# Patient Record
Sex: Female | Born: 1965 | Race: White | Hispanic: No | Smoking: Current every day smoker
Health system: Southern US, Community
[De-identification: ages and names within clinical notes are randomized; demographics above are authoritative.]

## PROBLEM LIST (undated history)

## (undated) DIAGNOSIS — R928 Other abnormal and inconclusive findings on diagnostic imaging of breast: Secondary | ICD-10-CM

## (undated) DIAGNOSIS — G40909 Epilepsy, unspecified, not intractable, without status epilepticus: Secondary | ICD-10-CM

## (undated) HISTORY — DX: Epilepsy, unspecified, not intractable, without status epilepticus: G40.909

## (undated) HISTORY — PX: WISDOM TOOTH EXTRACTION: SHX21

## (undated) HISTORY — PX: MASTECTOMY: SHX3

## (undated) HISTORY — PX: CHOLECYSTECTOMY: SHX55

## (undated) HISTORY — DX: Other abnormal and inconclusive findings on diagnostic imaging of breast: R92.8

---

## 1998-09-21 ENCOUNTER — Ambulatory Visit (HOSPITAL_BASED_OUTPATIENT_CLINIC_OR_DEPARTMENT_OTHER): Admission: RE | Admit: 1998-09-21 | Discharge: 1998-09-21 | Payer: Self-pay | Admitting: General Surgery

## 1999-02-08 ENCOUNTER — Other Ambulatory Visit: Admission: RE | Admit: 1999-02-08 | Discharge: 1999-02-08 | Payer: Self-pay | Admitting: *Deleted

## 1999-03-01 ENCOUNTER — Ambulatory Visit (HOSPITAL_COMMUNITY): Admission: RE | Admit: 1999-03-01 | Discharge: 1999-03-01 | Payer: Self-pay | Admitting: *Deleted

## 1999-03-01 ENCOUNTER — Encounter: Payer: Self-pay | Admitting: *Deleted

## 1999-10-10 ENCOUNTER — Inpatient Hospital Stay (HOSPITAL_COMMUNITY): Admission: AD | Admit: 1999-10-10 | Discharge: 1999-10-10 | Payer: Self-pay | Admitting: Obstetrics and Gynecology

## 1999-10-12 ENCOUNTER — Observation Stay (HOSPITAL_COMMUNITY): Admission: AD | Admit: 1999-10-12 | Discharge: 1999-10-13 | Payer: Self-pay | Admitting: *Deleted

## 2000-01-07 ENCOUNTER — Inpatient Hospital Stay (HOSPITAL_COMMUNITY): Admission: AD | Admit: 2000-01-07 | Discharge: 2000-01-07 | Payer: Self-pay | Admitting: *Deleted

## 2000-01-15 ENCOUNTER — Encounter (INDEPENDENT_AMBULATORY_CARE_PROVIDER_SITE_OTHER): Payer: Self-pay

## 2000-01-15 ENCOUNTER — Inpatient Hospital Stay (HOSPITAL_COMMUNITY): Admission: AD | Admit: 2000-01-15 | Discharge: 2000-01-18 | Payer: Self-pay | Admitting: Obstetrics and Gynecology

## 2000-02-19 ENCOUNTER — Other Ambulatory Visit: Admission: RE | Admit: 2000-02-19 | Discharge: 2000-02-19 | Payer: Self-pay | Admitting: *Deleted

## 2000-05-13 ENCOUNTER — Emergency Department (HOSPITAL_COMMUNITY): Admission: EM | Admit: 2000-05-13 | Discharge: 2000-05-13 | Payer: Self-pay

## 2001-02-25 ENCOUNTER — Encounter: Payer: Self-pay | Admitting: *Deleted

## 2001-02-25 ENCOUNTER — Encounter: Admission: RE | Admit: 2001-02-25 | Discharge: 2001-02-25 | Payer: Self-pay | Admitting: *Deleted

## 2001-06-11 ENCOUNTER — Other Ambulatory Visit: Admission: RE | Admit: 2001-06-11 | Discharge: 2001-06-11 | Payer: Self-pay | Admitting: Obstetrics and Gynecology

## 2001-10-27 ENCOUNTER — Emergency Department (HOSPITAL_COMMUNITY): Admission: EM | Admit: 2001-10-27 | Discharge: 2001-10-27 | Payer: Self-pay | Admitting: Emergency Medicine

## 2001-10-27 ENCOUNTER — Encounter: Payer: Self-pay | Admitting: *Deleted

## 2001-12-23 ENCOUNTER — Other Ambulatory Visit: Admission: RE | Admit: 2001-12-23 | Discharge: 2001-12-23 | Payer: Self-pay | Admitting: Obstetrics and Gynecology

## 2003-05-24 ENCOUNTER — Encounter: Payer: Self-pay | Admitting: Orthopedic Surgery

## 2003-05-24 ENCOUNTER — Ambulatory Visit (HOSPITAL_COMMUNITY): Admission: RE | Admit: 2003-05-24 | Discharge: 2003-05-24 | Payer: Self-pay | Admitting: Orthopedic Surgery

## 2003-05-27 ENCOUNTER — Ambulatory Visit (HOSPITAL_BASED_OUTPATIENT_CLINIC_OR_DEPARTMENT_OTHER): Admission: RE | Admit: 2003-05-27 | Discharge: 2003-05-27 | Payer: Self-pay | Admitting: Orthopedic Surgery

## 2003-06-07 ENCOUNTER — Ambulatory Visit (HOSPITAL_BASED_OUTPATIENT_CLINIC_OR_DEPARTMENT_OTHER): Admission: RE | Admit: 2003-06-07 | Discharge: 2003-06-07 | Payer: Self-pay | Admitting: Orthopedic Surgery

## 2003-07-12 ENCOUNTER — Ambulatory Visit (HOSPITAL_BASED_OUTPATIENT_CLINIC_OR_DEPARTMENT_OTHER): Admission: RE | Admit: 2003-07-12 | Discharge: 2003-07-12 | Payer: Self-pay | Admitting: Orthopedic Surgery

## 2005-03-27 ENCOUNTER — Encounter: Admission: RE | Admit: 2005-03-27 | Discharge: 2005-03-27 | Payer: Self-pay | Admitting: Surgery

## 2005-05-24 ENCOUNTER — Encounter: Admission: RE | Admit: 2005-05-24 | Discharge: 2005-05-24 | Payer: Self-pay | Admitting: Surgery

## 2005-05-27 ENCOUNTER — Encounter (INDEPENDENT_AMBULATORY_CARE_PROVIDER_SITE_OTHER): Payer: Self-pay | Admitting: Specialist

## 2005-05-27 ENCOUNTER — Ambulatory Visit (HOSPITAL_BASED_OUTPATIENT_CLINIC_OR_DEPARTMENT_OTHER): Admission: RE | Admit: 2005-05-27 | Discharge: 2005-05-27 | Payer: Self-pay | Admitting: Surgery

## 2005-05-27 ENCOUNTER — Encounter: Admission: RE | Admit: 2005-05-27 | Discharge: 2005-05-27 | Payer: Self-pay | Admitting: Surgery

## 2005-05-27 ENCOUNTER — Ambulatory Visit (HOSPITAL_COMMUNITY): Admission: RE | Admit: 2005-05-27 | Discharge: 2005-05-27 | Payer: Self-pay | Admitting: Surgery

## 2005-05-30 ENCOUNTER — Emergency Department (HOSPITAL_COMMUNITY): Admission: EM | Admit: 2005-05-30 | Discharge: 2005-05-30 | Payer: Self-pay | Admitting: Emergency Medicine

## 2005-06-11 ENCOUNTER — Ambulatory Visit (HOSPITAL_COMMUNITY): Admission: RE | Admit: 2005-06-11 | Discharge: 2005-06-11 | Payer: Self-pay | Admitting: Neurology

## 2006-09-04 ENCOUNTER — Encounter: Admission: RE | Admit: 2006-09-04 | Discharge: 2006-09-04 | Payer: Self-pay | Admitting: Gastroenterology

## 2007-03-13 ENCOUNTER — Encounter: Admission: RE | Admit: 2007-03-13 | Discharge: 2007-03-13 | Payer: Self-pay | Admitting: Surgery

## 2008-04-05 ENCOUNTER — Emergency Department (HOSPITAL_COMMUNITY): Admission: EM | Admit: 2008-04-05 | Discharge: 2008-04-05 | Payer: Self-pay | Admitting: Emergency Medicine

## 2008-04-25 ENCOUNTER — Encounter: Admission: RE | Admit: 2008-04-25 | Discharge: 2008-04-25 | Payer: Self-pay | Admitting: Surgery

## 2008-10-04 ENCOUNTER — Encounter: Admission: RE | Admit: 2008-10-04 | Discharge: 2008-10-04 | Payer: Self-pay | Admitting: Orthopedic Surgery

## 2008-11-01 ENCOUNTER — Emergency Department (HOSPITAL_COMMUNITY): Admission: EM | Admit: 2008-11-01 | Discharge: 2008-11-01 | Payer: Self-pay | Admitting: Emergency Medicine

## 2009-12-17 IMAGING — CT CT ANGIO CHEST
1 of 2 series · 19 of 32 positions shown · IV contrast (APPLIED)
Comparison: None

CLINICAL DATA: Seizures. Syncope.  Elevated D-dimer.

CT ANGIOGRAPHY CHEST
TECHNIQUE: Multidetector CT imaging of the chest was performed
using the standard protocol during bolus administration of
intravenous contrast. Multiplanar CT image reconstructions
including MIPs were obtained to evaluate the vascular anatomy.
Contrast: 80 ml of omni 300

[Series 5: pe thins @ 1mm · axial · 0.65mm/px · z∈[-267,-9]mm · 19 of 284 slices shown]
[im 13/284  lung]
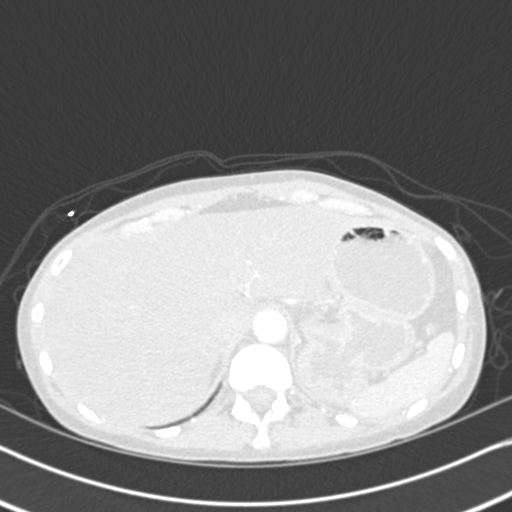
[im 25/284  soft-tissue]
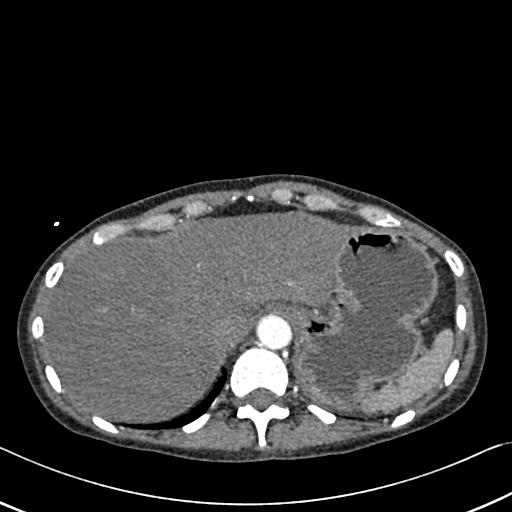
[im 37/284  lung]
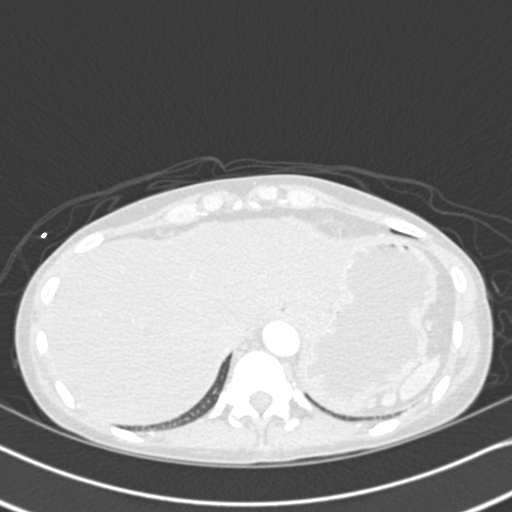
[im 62/284  soft-tissue]
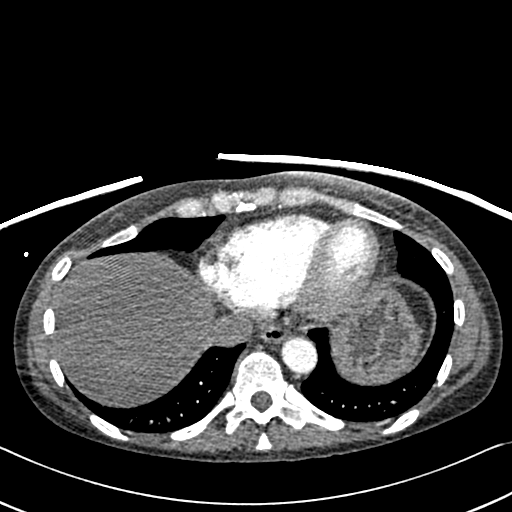
[im 74/284  lung]
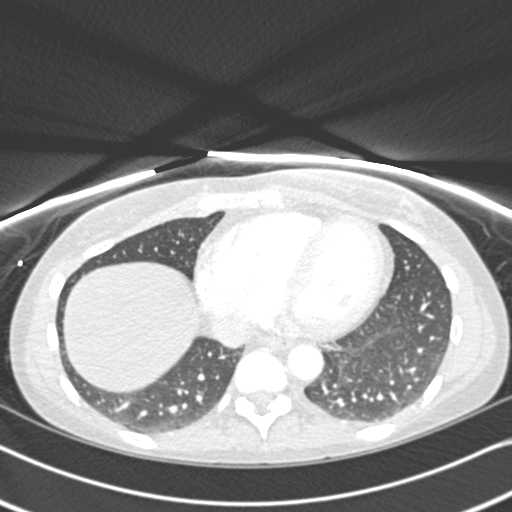
[im 87/284  soft-tissue]
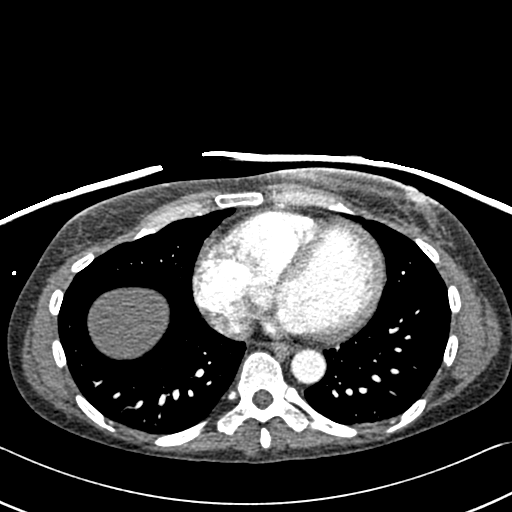
[im 99/284  lung]
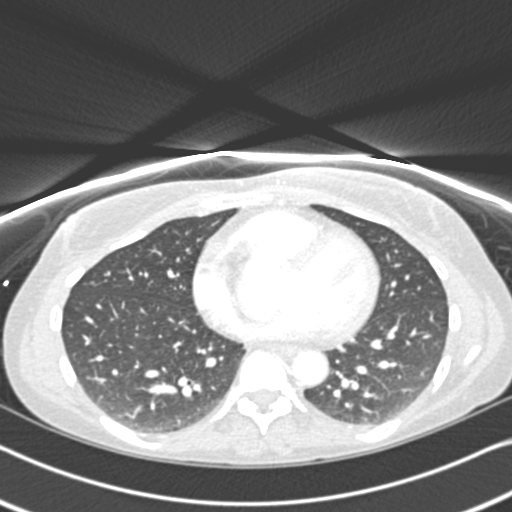
[im 111/284  soft-tissue]
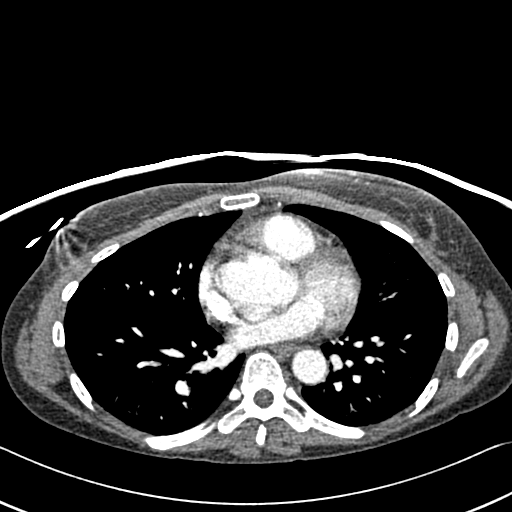
[im 124/284  lung]
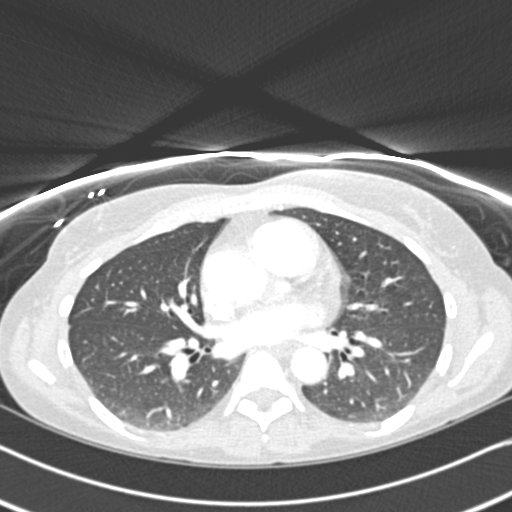
[im 148/284  soft-tissue]
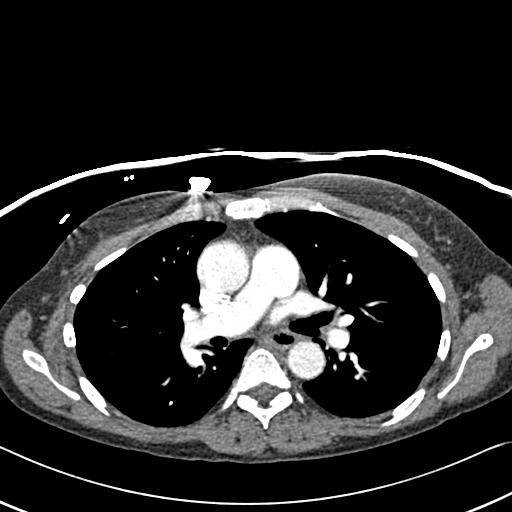
[im 160/284  lung]
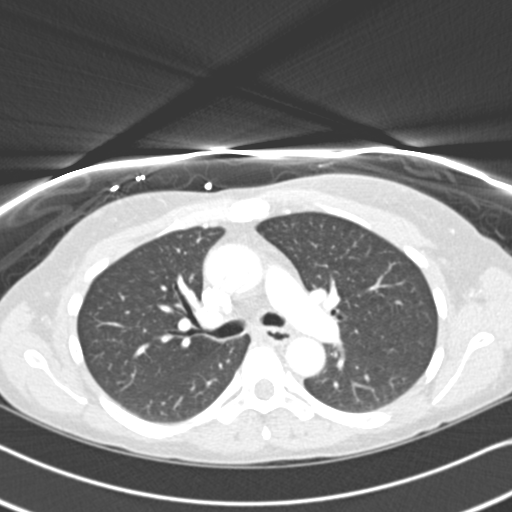
[im 173/284  soft-tissue]
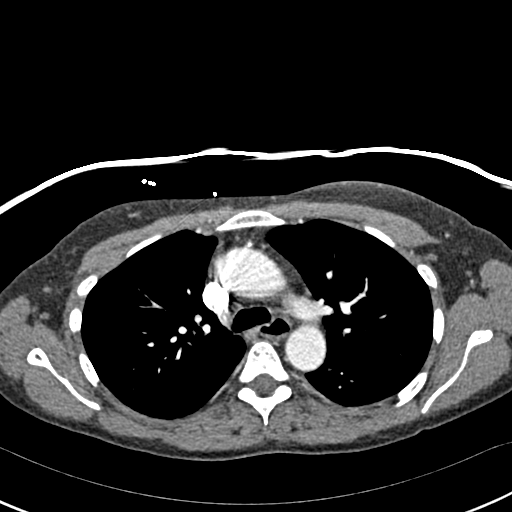
[im 185/284  lung]
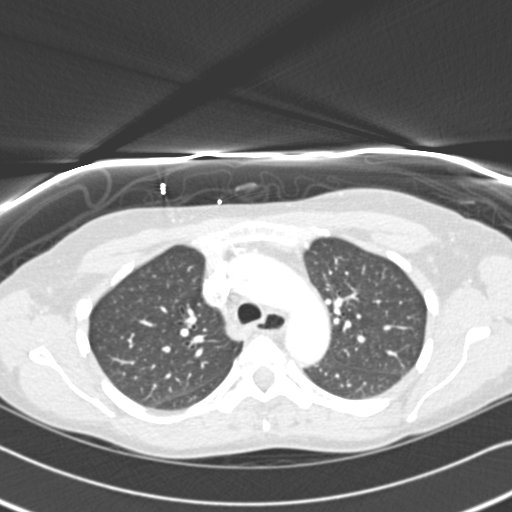
[im 197/284  soft-tissue]
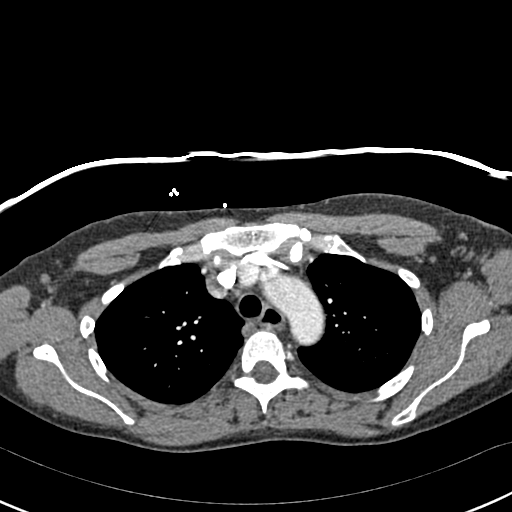
[im 210/284  lung]
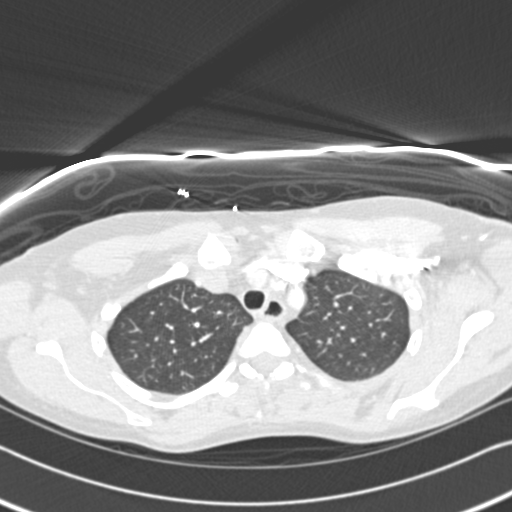
[im 222/284  soft-tissue]
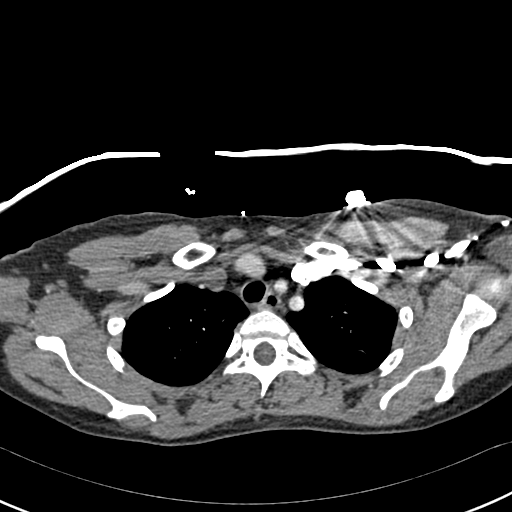
[im 247/284  lung]
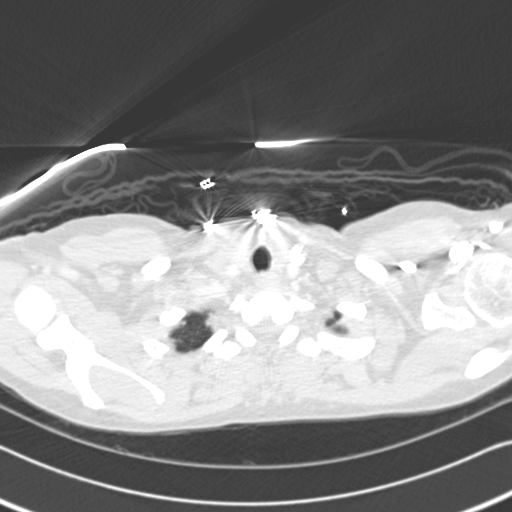
[im 259/284  soft-tissue]
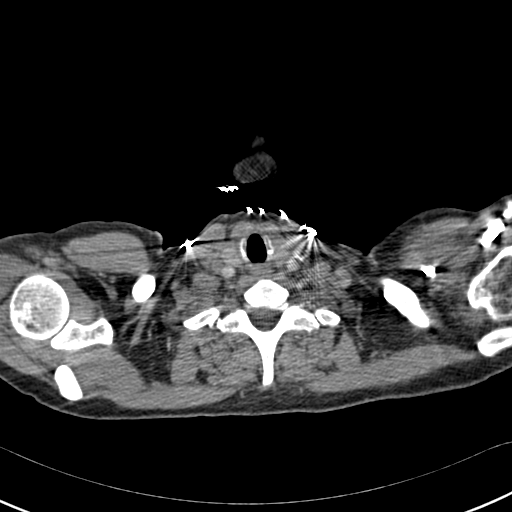
[im 271/284  lung]
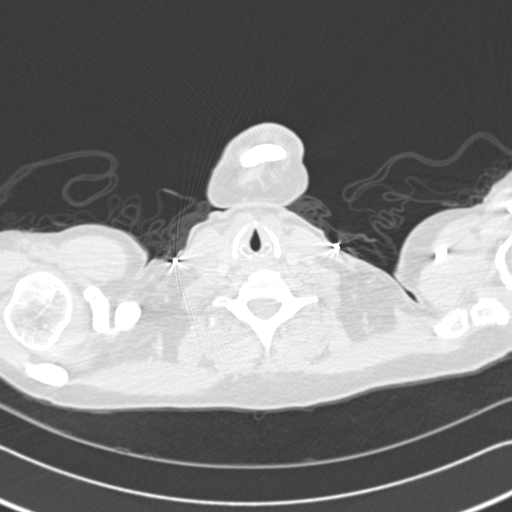

[19 of 32 positions shown; findings below may reference images not displayed]

FINDINGS: No enlarged axillary or supraclavicular lymph nodes
identified.

There is no enlarged mediastinal or hilar lymph nodes.

No pericardial or pleural effusion identified.

There is diffuse fatty infiltration of the liver.

No abnormal filling defects are noted within the main pulmonary
artery or its branches to suggest an acute pulmonary embolus.

The lungs are clear.

There is no airspace consolidation.

No pulmonary nodules or masses noted.

Review of the visualized osseous structures shows no suspicious
lytic or sclerotic lesions.

Limited imaging through the upper abdomen shows diffuse fatty
infiltration of the liver parenchyma.

 Review of the MIP images confirms the above findings.
IMPRESSION: 1.  No evidence for acute pulmonary embolus.
2.  Fatty infiltration of the liver.

## 2010-08-26 ENCOUNTER — Encounter: Payer: Self-pay | Admitting: Obstetrics and Gynecology

## 2010-08-26 ENCOUNTER — Encounter: Payer: Self-pay | Admitting: Gastroenterology

## 2010-11-15 LAB — D-DIMER, QUANTITATIVE: D-Dimer, Quant: 1.36 ug/mL-FEU — ABNORMAL HIGH (ref 0.00–0.48)

## 2010-11-15 LAB — COMPREHENSIVE METABOLIC PANEL
ALT: 73 U/L — ABNORMAL HIGH (ref 0–35)
AST: 123 U/L — ABNORMAL HIGH (ref 0–37)
Albumin: 4 g/dL (ref 3.5–5.2)
Alkaline Phosphatase: 52 U/L (ref 39–117)
BUN: 10 mg/dL (ref 6–23)
CO2: 20 mEq/L (ref 19–32)
Calcium: 9.2 mg/dL (ref 8.4–10.5)
Chloride: 102 mEq/L (ref 96–112)
Creatinine, Ser: 0.93 mg/dL (ref 0.4–1.2)
GFR calc Af Amer: >60 mL/min (ref >60)
GFR calc non Af Amer: >60 mL/min (ref >60)
Glucose, Bld: 132 mg/dL — ABNORMAL HIGH (ref 70–99)
Potassium: 4 mEq/L (ref 3.5–5.1)
Sodium: 139 mEq/L (ref 135–145)
Total Bilirubin: 2.2 mg/dL — ABNORMAL HIGH (ref 0.3–1.2)
Total Protein: 6.2 g/dL (ref 6.0–8.3)

## 2010-11-15 LAB — URINALYSIS, ROUTINE W REFLEX MICROSCOPIC
Bilirubin Urine: NEGATIVE
Glucose, UA: NEGATIVE mg/dL
Hgb urine dipstick: NEGATIVE
Ketones, ur: >80 mg/dL — AB
Nitrite: NEGATIVE
Protein, ur: 100 mg/dL — AB
Specific Gravity, Urine: 1.023 (ref 1.005–1.030)
Urobilinogen, UA: 1 mg/dL (ref 0.0–1.0)
pH: 8.5 — ABNORMAL HIGH (ref 5.0–8.0)

## 2010-11-15 LAB — CBC
HCT: 39 % (ref 36.0–46.0)
Hemoglobin: 13.1 g/dL (ref 12.0–15.0)
MCHC: 33.5 g/dL (ref 30.0–36.0)
MCV: 102 fL — ABNORMAL HIGH (ref 78.0–100.0)
Platelets: 99 10*3/uL — ABNORMAL LOW (ref 150–400)
RBC: 3.82 MIL/uL — ABNORMAL LOW (ref 3.87–5.11)
RDW: 13.3 % (ref 11.5–15.5)
WBC: 5.8 10*3/uL (ref 4.0–10.5)

## 2010-11-15 LAB — DIFFERENTIAL
Basophils Absolute: 0.1 10*3/uL (ref 0.0–0.1)
Basophils Relative: 1 % (ref 0–1)
Eosinophils Absolute: 0 10*3/uL (ref 0.0–0.7)
Eosinophils Relative: 0 % (ref 0–5)
Lymphocytes Relative: 12 % (ref 12–46)
Lymphs Abs: 0.7 10*3/uL (ref 0.7–4.0)
Monocytes Absolute: 0.4 10*3/uL (ref 0.1–1.0)
Monocytes Relative: 6 % (ref 3–12)
Neutro Abs: 4.7 10*3/uL (ref 1.7–7.7)
Neutrophils Relative %: 81 % — ABNORMAL HIGH (ref 43–77)

## 2010-11-15 LAB — URINE MICROSCOPIC-ADD ON

## 2010-11-15 LAB — RAPID URINE DRUG SCREEN, HOSP PERFORMED
Amphetamines: NOT DETECTED
Barbiturates: NOT DETECTED
Benzodiazepines: POSITIVE — AB
Cocaine: NOT DETECTED
Opiates: NOT DETECTED
Tetrahydrocannabinol: NOT DETECTED

## 2010-11-15 LAB — ETHANOL: Alcohol, Ethyl (B): <5 mg/dL (ref 0–10)

## 2010-11-15 LAB — GLUCOSE, CAPILLARY: Glucose-Capillary: 142 mg/dL — ABNORMAL HIGH (ref 70–99)

## 2010-11-15 LAB — PREGNANCY, URINE: Preg Test, Ur: NEGATIVE

## 2010-12-18 NOTE — Procedures (Signed)
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  345.40,  345.10.  The patient had a pair of seizures lasting 2 minutes and 1  minute respectively 3-1/2 hours prior to the study.  This is being done  to look for presence of seizures or seizure focus.   PROCEDURE:  The tracing is carried out on a 32-channel digital Cadwell  recorder reformatted into 16 channel montages with one devoted to EKG.  The patient was awake and drowsy during the recording.  The  [employer] 10/20 system lead placement was used.  She takes  norethindrone, trazodone.   DESCRIPTION OF FINDINGS:  Dominant frequency is at 10 Hz 15 microvolt  activity that is well regulated.  Background activity is a mixture of  predominately alpha upper theta range activity with frontally  predominant beta range components.  The patient becomes drowsy with  mixed frequency theta and delta range activity, sleep does not occur.   There was no focal slowing.  There was no interictal epileptiform  activity in the form of spikes or sharp waves.   Activating procedures were not carried out.   EKG showed regular sinus rhythm with ventricular response of 90 beats  per minute.   IMPRESSION:  Normal record with the patient awake and drowsy.      Deanna Artis. Sharene Skeans, M.D.  Electronically Signed     GLO:VFIE  D:  11/01/2008 33:29:51  T:  11/02/2008 05:24:19  Job #:  [id number]   cc:   Genene Churn. Love, M.D.  Fax: [phone]
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
## 2010-12-21 NOTE — Op Note (Signed)
Kristi Thomas, Kristi Thomas               ACCOUNT NO.:  [id number]   MEDICAL RECORD NO.:  [id number]          PATIENT TYPE:  AMB   LOCATION:  DSC                          FACILITY:  [facility]   PHYSICIAN:  Thornton Park. Daphine Deutscher, MD  DATE OF BIRTH:  04/06/1966   DATE OF PROCEDURE:  05/27/2005  DATE OF DISCHARGE:                                 OPERATIVE REPORT   PREOPERATIVE DIAGNOSIS:  New linear calcifications, deep in right breast.  Birads 4--   POSTOPERATIVE DIAGNOSIS:  New linear calcifications, deep in right breast.  Permanent path sections pending   PROCEDURE:  Needle- localized right breast biopsy.   SURGEON:  Thornton Park. Daphine Deutscher, MD   ANESTHESIA:  General endotracheal.   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  Preoperatively, she received a gram  of Ancef and had PAS hose in place.  Both breasts were prepped for possible  left breast biopsy if time permitted; the main focus was on the wire that  had been placed earlier in the day by Dr. Baird Lyons at the [facility].  The wire came in lateral to the areolar margin probably an inch and a  quarter and went from lateral to medial in a retroareolar location.  The  calcifications were just anterior to the wire near the tip.  I elected to go  ahead and make an incision through the exit point of the wire and then  develop superior anterior and posterior flaps.  I marked the skin with a  skin marker so that I could subsequently facilitate closure of the wound and  then developed a posterior plane along the posterior aspect of the wire.  I  carried this down from lateral to medial, to where I encountered the chest  wall and then began going along the chest wall, going medially.  I felt that  I had a sufficient depth and medial dissection, and then began the more  anterior aspect, where I went and encountered the right subareolar area and  initially was fairly close to  the areolar margin and then backed off and  came beneath the nipple before plunging to the chest wall.  When I came down  to the chest wall, I encountered the tip of the wire and that caused me to  want to get a little more medial and more anterior, which I did, to ensure  that these the calcifications were obtained.  I then went lateral toward the  axilla to get a margin there and then took this mass off from the chest wall  carefully.  This was a fairly tedious dissection because it was several  centimeters deep.  I had also transfixed the wire to the underlying fatty  tissue with 4-0 Vicryl and it did not seem to move.  Once this was out, I  did have a little arterial bleeder which I clamped and ligated with a figure-  of-eight suture of 4-0 Vicryl.  I sent this for specimen mammography and in  the meantime, I went ahead  and got a little bit more  of a medial margin and  there appeared to be some fibrocystic change and there was a little cyst in  that area as well.  When I was dissecting through the breast tissue,  particularly in the area of the presumed calcifications, I encountered some  slightly dilated ducts containing the grayish thick, viscid material of  fibrocystic disease and I did not encounter anything that was worrisome for  malignancy.  I did remove more fibrocystic tissue more medial to enhance the  medial margin; I sent that as a separate specimen.  When completed, I  irrigated copiously with saline.  I had used the pencil-tip electrocautery  throughout the entire dissection and used this to coagulate the bed of the  wound.  I irrigated again several times until it was dry.  I then  infiltrated this area was 1% Marcaine with epinephrine.  I closed it first  by approximating some of the deep breast tissue and then subcutaneously, I  used the 2 blue skin marking that I had to aligned the skin edges and  approximated that, and then put another subcutaneous suture in the  middle  before closing the skin with a running subcuticular 5-0 Vicryl.  The wound  was also infiltrated with some Kenalog before  Benzoin and Steri-Strips were applied to the skin.  The patient did seem to  tolerate the procedure well.  The specimen mammogram reveal that the  calcifications were well within the specimen and margins looked good, and  then she was taken to the recovery room in satisfactory condition.      Thornton Park Daphine Deutscher, MD  Electronically Signed     MBM/MEDQ  D:  05/27/2005  T:  05/28/2005  Job:  [id number]   cc:   Randye Lobo, M.D.  Fax: [phone]

## 2010-12-21 NOTE — Op Note (Signed)
NAME:  Kristi Thomas, Kristi Thomas                    ACCOUNT NO.:  [id number]   MEDICAL RECORD NO.:  [id number]                   PATIENT TYPE:  AMB   LOCATION:  DSC                                  FACILITY:  [facility]   PHYSICIAN:  Katy Fitch. Naaman Plummer., M.D.          DATE OF BIRTH:  1966/01/20   DATE OF PROCEDURE:  05/27/2003  DATE OF DISCHARGE:                                 OPERATIVE REPORT   PREOPERATIVE DIAGNOSIS:  Rupture of right thumb radial collateral at distal  insertion on proximal phalanx and volar plate.   POSTOPERATIVE DIAGNOSIS:  Rupture of right thumb radial collateral at distal  insertion on proximal phalanx and volar plate with identification of mild  chondromalacia of metacarpal head, particularly palmar radial aspect with  marginal osteophytes noted.   OPERATIONS:  1. Examination of right thumb metacarpophalangeal joint under anesthesia     demonstrating 40 degrees of ulnar deviation instability at 30 degrees     flexion and 30 degrees ulnar deviation instability at 0 degrees.  The     ulnar collateral ligament complex was stable at 0 and 30 degrees flexion.     There was slightly increased dorsal palmar translational instability     noted compared with the left thumb on examination under anesthesia.  2. Reconstruction of right thumb radial collateral ligament utilizing a 3-0     Kevlar three-bone suture followed by 0.045 inch Kirschner wire fixation     of the MP joint and radial deviation correction with correction of     subluxation.   OPERATING SURGEON:  Katy Fitch. Sypher, M.D.   ASSISTANT:  Jonni Sanger, P.A.   ANESTHESIA:  General by LMA.   SUPERVISING ANESTHESIOLOGIST:  Guadalupe Maple, M.D.   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   She presented for evaluation on  April 19, 2003, 47 hours following the  injury.   At that time, she was noted to 3+ swelling of her right thumb  metacarpophalangeal joint.  Examination limited by pain revealed intact  function of the ulnar collateral ligament at 0 and 30 degrees flexion,  significant laxity of the radial collateral ligament, and no sign of palmar  subluxation or pronation deformity.   Plain x-rays obtained at the office demonstrated no sign of fracture or  frank ulnar deviation or pronation instability.   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  Unfortunately, we were  unable to achieve a splint solution that would lead to satisfactory comfort  nor adequate function.   She was subsequently placed in a thumb spica cast for 21 days and upon  removal of her cast had satisfactory stability clinically.   She attempted to return to the rigors of  general surgery and had persistent  pain.  Therefore, an MRI was obtained and interpreted by Francene Boyers, M.D.   This revealed evidence of a complete rupture of the distal radial collateral  ligament at the proximal phalangeal insertion.  This was in an anatomic  position deep to the extensor aponeurosis with no signs of palmar  subluxation of the proximal phalanx nor pronation instability.  However,  given the demands of surgery, we recommended proceeding with reconstruction  for a reliable repair and early rehabilitation.   A secondary issue noted by Dr. 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   There were no signs of frank arthritis.   After informed consent on May 25, 2003, at the office, we recommended  proceeding  with examination of the thumb under anesthesia at this time and  reconstruction of the radial collateral ligament.   Prior to surgery, Dr. Luan Thomas was informed of the potential risks and  benefits.  The foreseen benefits of the procedure are reliable  reconstruction at an anatomic length of the radial collateral ligament  assuring correction of palmar subluxation and pronation at the MP joint.  Our goal is to obtain a congress and stable right thumb metacarpophalangeal  joint that will allow years of professional service.   The potential risks with surgery include the usual risks of possible  infection, pin breakage, failure to obtain satisfactory healing, and the  less likely consequences of possible development of neurovascular  impairment, chronic dysesthesia from retraction of the radial sensory  branches, possible pin fracture or failure, and the development of osseous  infection.   We anticipated as many of these potential complications as possible and  intend to bury her pins subcutaneously to prevent pin track infection,  anticipating a second procedure for pin removal.  In addition, given the  fact that there was preexistent chondromalacia noted, there is some chance  that there will be some degree of persistent pain following stabilization of  the joint which could ultimately require arthrodesis, i.e. fusion of the  joint, to allow function as a Development worker, international aid.   From the outset of this injury we have discussed that there may be a degree  of permanent impairment that could have a negative impact on Dr.  Wyonia Thomas ability to return to general surgery, however, given extensive  past experience with similar injuries with a small degree of chondromalacia,  our collective clinical experience has been favorable, leading to a  favorable prognosis for this injury.   DESCRIPTION OF PROCEDURE:  Gracen Ringwald was brought to the operating room and placed in the supine  position upon the operating table.   Following induction of general anesthesia by LMA, the right arm was prepped  with Betadine soap and solution and sterile draped.  A pneumatic tourniquet  was applied to the proximal brachium.  One gram of Ancef was administered as  an IV prophylactic antibiotic.   Following exsanguination of the right arm with an Esmarch bandage, the  arterial tourniquet on the proximal brachium was inflated to 220 mmHg.   A skin pin was used to marked out a lazy S incision paralleling the radial  thenar creases.   The incision was taken sharply through dermis with very careful  dissection  of the subcutaneous tissues, identifying a large radial dorsal sensory  branch across the aponeurosis approximately 4 mm ulnar to the extensor  pollicis brevis.   This was gently dissected free from the surrounding adipose tissue and a  single branch heading dorsally over the MP joint was sacrificed to allow  retraction in a palmar direction.   The extensor aponeurosis was taken down longitudinally on the ulnar aspect  of the extensor pollicis brevis insertion and gently retracted, exposing the  entire radial capsule.   The radial collateral ligament was well defined.  This was of relatively  small caliber and had a normal insertion on the metacarpocarpal head.  The  distal insertion and the accessory collateral ligament fibers to the volar  plate were quite attenuated and baggy.   The collateral ligament was retracted with a small Ragnell retractor and a  house microcurette was used to excavate bone at the critical corner to  bleeding cancellus bone.  Care was taken to use a fine rongeur and  irrigation to prevent any bone fragments remaining within the joint.   The collateral ligament was gathered with a Krackow suture of 3-0 Kevlar  followed by the use of two medium Mellody Dance needles to create bone tunnels  across the base of the proximal phalanx from the critical corner of  the  proximal phalanx to the diaphysis on the ulnar aspect of the proximal  phalanx.   Care was taken to avoid the neurovascular bundles, as well as the extensor  mechanism.   A small counter incision was created on the ulnar aspect of the proximal  phalanx allowing examination of the bone tunnels and placement of the Kevlar  sutures.   Prior to tensioning the repair of the radial collateral ligament, the joint  was placed in approximately 10 degrees of flexion and slight radial  deviation and secured with two 0.045 inch Kirschner wires with correction of  the palmar subluxation.   AP and lateral C-arm images documented good correction of the joint position  and avoidance of the bony tunnels.   The pins were trimmed and buried beneath the skin with ends bent to prevent  migration.   The Kevlar suture was then tensioned, gathering all of the slack from the  radial collateral ligament and indirectly correcting the volar plate laxity.   The suture was tied over the ulnar cortex of the proximal phalanx.   The wound was thoroughly lavaged with sterile saline followed by repair of  the extensor aponeurosis with a row of figure-of-eight mattress sutures of 4-  0 Kevlar, knots buried, and a running suture of 4-0 Kevlar as a finishing  suture.   Full IP motion was maintained without interference on the extensor mechanism  by the K-wires.   Throughout the dissection, the radial sensory branch was meticulously  retracted with a silicone vessel loop.  There were no apparent  complications.   The skin wound was repaired with intradermal 3-0 Prolene and Steri-Strips.  A compressive dressing was applied with sterile gauze, sterile Webril, and  volar and radial plaster splints, maintaining the thumb in a palmar abducted  position.   Dr. Luan Thomas was noted to be nauseated prior to surgery and had been given  Zofran by Dr. Noreene Larsson.  Postoperatively we anticipate observation in the   [facility] utilizing IV antibiotic fluids, IV and p.o. medications  for nausea, and IV prophylactic antibiotics in the form of Ancef 1 g q.8h.  She will  be given appropriate analgesics in the form of IV PCA morphine, IV  or p.o. Dilaudid, and Vicodin ES upon discharge.   At discharge, we anticipate Keflex 500 mg one p.o. q.8h. x 4 days as a  prophylactic antibiotic.  She will be given Motrin 600 mg one p.o. q.6h. as  her primary analgesic and Vicodin ES as needed one p.o. q.4-6h. p.r.n. pain.  She will return to my office in 10 days for dressing change, suture removal,  and advancement to a thumb spica cast.   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  We will remove her pins at six weeks with the second surgical procedure.   We anticipate that she will require three to four weeks of postoperative  rehabilitation exercise prior to achieving a pinch or grip strength that  will allow clinical surgery.   She has been advised to discuss these issues with her partners for short-  term disability management and perhaps her disability insurance carrier for  long-term disability.                                               Katy Fitch Naaman Plummer., M.D.    RVS/MEDQ  D:  05/27/2003  T:  05/27/2003  Job:  [id number]

## 2010-12-21 NOTE — Op Note (Signed)
NAME:  Kristi Thomas, Kristi Thomas                    ACCOUNT NO.:  [id number]   MEDICAL RECORD NO.:  [id number]                   PATIENT TYPE:  AMB   LOCATION:  DSC                                  FACILITY:  [facility]   PHYSICIAN:  Katy Fitch. Naaman Plummer., M.D.          DATE OF BIRTH:  January 09, 1966   DATE OF PROCEDURE:  07/12/2003  DATE OF DISCHARGE:                                 OPERATIVE REPORT   PREOPERATIVE DIAGNOSIS:  Status post repair of right thumb  metacarpophalangeal radial collateral ligament distal insertion with through  bone Kevlar suture completed on April 27, 2003, stabilized by two 0.045  inch Kirschner wires across the metacarpophalangeal joint with correction of  ulnar deviation and subluxation instability of right thumb  metacarpophalangeal joint with correction of ulnar deviation and subluxation  instability of right thumb metacarpophalangeal joint on May 27, 2003.   POSTOPERATIVE DIAGNOSIS:  Status post repair of right thumb  metacarpophalangeal radial collateral ligament distal insertion with through  bone Kevlar suture completed on April 27, 2003, stabilized by two 0.045  inch Kirschner wires across the metacarpophalangeal joint with correction of  ulnar deviation and subluxation instability of right thumb  metacarpophalangeal joint with correction of ulnar deviation and subluxation  instability of right thumb metacarpophalangeal joint on May 27, 2003.   OPERATION PERFORMED:  Removal of remaining buried Kirschner wire and  examination of right thumb metacarpophalangeal under anesthesia.   SURGEON:  Katy Fitch. Sypher, M.D.   ASSISTANT:  Jonni Sanger, P.A.   ANESTHESIA:  0.25% Marcaine and 2% lidocaine metacarpal head level block  supplemented by IV sedation.   SUPERVISING ANESTHESIOLOGIST:  Bedelia Person, M.D.   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  She attempted to  treat her injury with a series of splints and a hard cast for three weeks;  however, upon efforts to return to the operating room environment, completed  a rupture of the radial collateral ligament while using a mechanical stapler  and had severe pain and instability precluding her ability to work.  On  May 24, 2003, she underwent an MRI without contrast of the right thumb  which documented a complete rupture of the distal insertion of the radial  collateral ligament.  After informed consent, she is brought to the  operating room on May 27, 2003 and underwent an elective reconstruction  of her radial and palmar radial metacarpophalangeal joint thumb capsule  utilizing a through bone Kevlar suture with stabilization of her right thumb  metacarpophalangeal with two 0.045 inch Kirschner wires correcting her  palmar subluxation of  P1 and allowing correction of her ulnar  deviation  instability.  Unfortunately, she experienced a severe pain syndrome due to  tenting of one of her radial sensory branches over her radial 0.045 inch  Kirschner wire requiring a return to the operating room on June 07, 2003  for elective pin removal and incidental neurolysis of the radial sensory  branch.  After correction of this nerve compression predicament, she has had  improvement in the sensibility along the radial and dorsal aspect of her  thumb and relief of her severe pain syndrome noted immediately  postoperatively.  She has now completed her six week immobilization of her  MP joint allowing healing of her radial collateral ligament reconstruction  and is returned to the operating room for  elective Kirschner wire removal.   Preoperatively in the holding area, we advised her that we would proceed  under sedation and local anesthesia utilizing 0.25% Marcaine and 2%  lidocaine supplemented by IV sedation under the supervision of Dr. Gypsy Balsam.  After informed consent she is brought to the operating room at this time.   DESCRIPTION OF PROCEDURE:  Mashal Slavick was brought to the operating  room and placed in supine position on the operating table.  Following light  sedation, the right arm was prepped with alcohol and Betadine and 0.25%  Marcaine and 2% lidocaine were infiltrated into the path of the intended  incision.  The C-arm fluoroscope was brought in to the field prior to  prepping her arm and an intact and nonbent pin was confirmed.  The right arm  was then prepped with Betadine soap and solution and sterilely draped.  A  pneumatic tourniquet was applied to the proximal right brachium.  Following  exsanguination of the right arm with an Esmarch bandage, an arterial  tourniquet was inflated to 220 mmHg.  A 4 mm incision was fashioned directly  over the confirmed pin site.  Subcutaneous tissues were carefully divided  gently spreading with a fine hemostat.  A dorsal vein and one of the dorsal  radial sensory branches was gently retracted with a small Joseph skin hook  followed by incision of the pseudocapsule around the pin with a 15 blade.  The pin tip was expressed without difficulty utilizing a fine hemostat and a  large needle driver was used to gently remove the pin.  Range of motion of  the MP joint was noted to be from a 10 degree flexion contracture to further  flexion of approximately 35 degrees.  The joint was completely stable at the  limits of flexion-extension to radial deviation and ulnar deviation.  We  would expect this joint to be quite tender during the initial mobilization  phase.  The wound was irrigated with sterile saline followed by repair  with intradermal 4-0 Prolene and a Steri-Strip.  A compressive dressing was  applied with sterile gauze, sterile Webril and an Ace wrap.  We have asked  Dr. [name] to rest her thumb for three days.  She will use naproxen  sodium in the form of Aleve and Vicodin ES one or tablets by mouth every six  hours as needed for pain.  She was given 24 tablets for postoperative  analgesia. She will return to our office in follow-up in three days to  remove the dressing and initiate a range of motion exercise program.  She  understands it will be three to five weeks until she is able to return to  her usual work status.  Katy Fitch Naaman Plummer., M.D.    RVS/MEDQ  D:  07/12/2003  T:  07/13/2003  Job:  [id number]   cc:   Vikki Ports, M.D.  [address]  [place]  [place] [address]  Fax: [phone]

## 2010-12-21 NOTE — Consult Note (Signed)
Kristi Thomas, FRIBERG NO.:  [id number]   MEDICAL RECORD NO.:  [id number]          PATIENT TYPE:  EMS   LOCATION:  MAJO                         FACILITY:  [facility]   PHYSICIAN:  Genene Churn. Love, M.D.    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  Loss of consciousness was approximately 2 1/2 minutes  and she was confused afterwards.  She had no warning of the event and denies  any previous macropsia, micropsia, Deja vu, strange odors, or taste.  She  has no history of seizures or family history of seizures.  In this past  week, she has had a very active week.  She had a breast biopsy performed  May 28, 2005, as an outpatient and had been on [employer] 50/25  taken, taking about 12 in two days, the last at 4 a.m. on the morning of  May 30, 2005.  She has also been on Macronair, a birth control agent,  Reglan, and Protonix.  She drinks about 2-4 drinks per week of alcohol, she  smokes 1/2 pack per day of cigarettes, she has no known allergies.   Her past medical history is significant for migraine headaches with visual  disturbance seen by me approximately one year ago.  She has had an  electrical injury in the OR.  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   PHYSICAL EXAMINATION:  GENERAL:  Well developed white female.  VITAL SIGNS:  Blood pressure right and left arms 120/80, heart rate was 84  and regular.  There were no bruits.  NEUROLOGICAL:  Mental status exam reveals she is alert and oriented x 3,  following one, two, and three step commands.  Cranial nerve examination  reveals visual fields full, discs flat, extraocular movements full, corneals  present, no seventh nerve palsy, tongue was midline, uvula midline, gag was  present.  Sternocleidomastoid muscle and trapezius testing normal.  Motor  examination revealed 5/5 strength in the upper and lower extremities.  Sensory examination was intact to pin prick, touch, arm position.  Deep  tendon reflexes were 2+.  Plantar response was downgoing.   MRI study of the brain showed calcification in the basal ganglia bilaterally  with increased 2T signal symmetrically present indicating benign  calcifications.  White blood cell count 6,600, hemoglobin 11.7, hematocrit  34.8, platelet count 192,000.  Sodium 133, potassium 3.3, chloride 97, CO2  content 25, BUN 11, creatinine 0.8.  SGOT 66, SGPT 48.  Drug screen was  negative.   IMPRESSION:  Single seizure, 345.10, probably medication related.   PLAN:  Obtain an EEG within 12 to 24 hours, place her on seizure  precautions, and not begin medications  at this time.  She will be at home  with around the clock sitters over the next 36 hours.          ______________________________  Genene Churn. Sandria Manly, M.D.    JML/MEDQ  D:  05/30/2005  T:  05/31/2005  Job:  [id number]

## 2010-12-21 NOTE — Procedures (Signed)
EEG NUMBER:  __________.   MEDICATIONS:  Meperidine, morphine sulfate, Reglan, Phenergan and Protonix.   CLINICAL INFORMATION:  This patient has had a witnessed seizure.   TECHNICAL DIAGNOSTICS:  This EEG was recorded during the awake state.  The  background activity shows low-voltage fast beta activity interrupted with  some fast alpha rhythms.  There is no evidence of any focal asymmetry or  stage II sleep.  Hyperventilation testing was performed and photic  stimulation was performed which did not produce any significant  abnormalities.  No focal asymmetries or epileptiform activity were present.   IMPRESSION:  This is a normal EEG during the awake state with much low-  voltage fast beta activity suggestive of medication effect.           ______________________________  Genene Churn. Sandria Manly, M.D.     ZOX:WRUE  D:  05/30/2005 45:40:98  T:  05/31/2005 10:00:39  Job #:  [id number]

## 2010-12-21 NOTE — H&P (Signed)
[facility]  Patient:    Kristi Thomas, Kristi Thomas                 MRN: [id number] Adm. Date:  [id number] Attending:  Maxie Better CC:         Sung Amabile. Roslyn Smiling, M.D.                         History and Physical  CHIEF COMPLAINT:                  Preterm labor not responsive to outpatient management.  HISTORY OF PRESENT ILLNESS:       A 45 year old woman, G2, P70, [facility] February 09, 2000, on the basis of first trimester ultrasound, admitted with preterm labor which has een unresponsive to bed rest, oral hydration, and oral terbutaline.  The patient is  currently at 23+ weeks gestational age.  She complained of regular contractions on October 10, 1999.  She was evaluated in the office and, although the cervix was closed and firm, it was slightly shortened and there appeared to be posterior lower uterine segment funneling.  She was sent to [facility] where oral terbutaline was initiated and sent home.  Over the last 48 hours, she has persistently had between 4 and 12 contractions hourly.  She has had no bleeding.  Ultrasound at [redacted] weeks gestational age was normal.  Urinalysis in the office was negative.  She s admitted now for magnesium therapy.                                    Antenatal course has also been remarkable for  advanced maternal age.  Amniocentesis performed at 16 weeks showed normal natal  chromosomes.  PAST MEDICAL HISTORY:             Pyelonephritis in 58 and 1997. Electrocution in the operating room in 1997.  PAST SURGICAL HISTORY:            Laparoscopic cholecystectomy in 1994. Herniorrhaphy in 1995.  Left inguinal herniorrhaphy February 2000.  ALLERGIES:                        None.  MEDICATIONS:                      Prenatal vitamins and terbutaline.  OBSTETRICAL HISTORY:              Vaginal delivery May 1998 of 6 pound 6 ounce female at 39-1/[redacted] weeks gestational age.  FAMILY HISTORY:                   Father and  brother with hypertension.  Mother and sister with history of depression.  Father with history of alcohol abuse.  SOCIAL HISTORY:                   Married.  Surgeon.  Denies tobacco or ethanol  use.  PHYSICAL EXAMINATION:  GENERAL:                          A healthy-appearing, gravid female.  VITAL SIGNS:                      Afebrile.  Vital signs stable.  Fetal heart rate 140s in the office.  HEENT:                            Within normal limits.  NECK:                             Without thyromegaly.  CHEST:                            Clear.  COR:                              Regular rate and rhythm.  S1, S2 normal.  BREASTS:                          Not examined.  ABDOMEN:                          Soft, nontender.  Fundal height 23 cm.  GU:                               Cervix closed, firm, 2 cm long, with funneling in the lower uterine segment posteriorly.  EXTREMITIES:                      Without clubbing, cyanosis, or edema.  NEUROLOGIC:                       Grossly intact, 2+ DTRs without clonus.  ANTENATAL LABORATORY DATA:        B positive.  RPR nonreactive, rubella immune,  hepatitis B surface antigen negative.  Amniotic fluid alpha-fetoprotein within normal limits.  Cervical cultures negative.  Pap smear July 2000 within normal limits.  IMPRESSION:                       1. Intrauterine pregnancy at [redacted] weeks gestational                                      age with preterm labor, nonresponsive to                                      outpatient management.                                   2. Rh positive.  PLAN:                             Admit for magnesium sulfate wash, observation  admission.  Restart oral tocolytics after uterus quiet and at least 12 hours of  magnesium sulfate. DD:  10/12/99 TD:  10/12/99 Job: [id number]

## 2010-12-21 NOTE — Discharge Summary (Signed)
[facility]  Patient:    Kristi Thomas, Kristi Thomas                 MRN: [id number] Adm. Date:  [id number] Disc. Date: [id number] Attending:  Maxie Better                           Discharge Summary  DISCHARGE DIAGNOSES:          1. Preterm labor at [redacted] weeks gestational age.                               2. Discharged undelivered.  HISTORY OF PRESENT ILLNESS:   A 45 year old woman, gravida 2, para 1, EDC July , 2001, on the basis of first trimester ultrasound, admitted with preterm labor which has been unresponsive to bed rest, oral hydration, and oral Terbutaline.  The patient is currently [redacted] weeks gestational age.  She complained of regular uterine contractions on October 10, 1999.  She was evaluated in the office and, although the cervix was closed and firm, it was slightly shortened and there appeared to be posterior lower uterine segment funneling.  She was sent to [facility] [place] where oral Terbutaline was initiated and she was sent home from there. Over the 48 hours after that, she had experienced between 4 and 12 contractions  hour.  She has had no bleeding.  Ultrasound at [redacted] weeks gestational age was normal. Urinalysis in the office was negative and she was admitted for magnesium therapy for control of preterm labor.  HOSPITAL COURSE:              The patient was admitted to the antenatal unit. Magnesium sulfate was initiated.  It was continued for the next 12 hours and uterine contractions abated.  She was discharged to home on the morning of October 13, 1999.  Procardia 10 mg q.6h. was initiated.  Modified bed rest was encouraged. She will be followed up in the office by Sung Amabile. Roslyn Smiling, M.D. on Tuesday. DD:  11/03/99 TD:  11/03/99 Job: [id number]

## 2010-12-21 NOTE — Op Note (Signed)
NAME:  Kristi Thomas, Kristi Thomas                    ACCOUNT NO.:  [id number]   MEDICAL RECORD NO.:  [id number]                   PATIENT TYPE:  AMB   LOCATION:  DSC                                  FACILITY:  [facility]   PHYSICIAN:  Katy Fitch. Naaman Plummer., M.D.          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   POSTOPERATIVE DIAGNOSIS:  Identification of tenting of radial dorsal sensory  branch at proximal metaphysis of thumb metacarpal, dorsal radial aspect with  clear mechanical irritation of radial sensory branch.   OPERATION PERFORMED:  1. Exploration of right thumb radial dorsal sensory branch overlying distal     metacarpal.  2. Identification of tenting of radial dorsal sensory branch followed by pin     removal and incidental neurolysis of radial sensory branch.   SURGEON:  Katy Fitch. Sypher, M.D.   ASSISTANT:  Jonni Sanger, P.A.   ANESTHESIA:  1% lidocaine and 0.25% Marcaine thumb block supplemented by IV  sedation.   SUPERVISING ANESTHESIOLOGIST:  Dr. Katrinka Blazing.   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  Therefore, on May 27, 2003,  she was brought to the operating room for an elective reconstruction of her  radial collateral ligament.  Given her need to return to her surgical career  as soon as possible and in an effort to avoid all possible infectious  complications, her Kirschner wires were buried subcutaneously.  At the time  of placement, the pins were placed from a high dorsal to low palmar aspect  in an effort to avoid the usual position of the radial sensory branches. In  the early postoperative period, Dr. [name] noted altered sensibility on  the dorsal radial aspect of her thumb, particularly at the base of the nail.  She then developed after splint breakage, a rather prominent neuralgia in  the radial dorsal sensory branch distribution that suggested mechanical  irritation of the radial dorsal sensory branch.  We attempted to manage this  by modifying a splint to place her thumb in maximal radial abduction which  would temporarily relieve her pain.  However, due to a persistent neuralgia  during the past  five days, requiring Neurontin as a means of managing her  pain, it was clear on follow-up examination June 06, 2003 that this  mechanical problem was not going to be corrected short of pin removal or  adjustment.  I recommended proceeding directly to the operating room at this  time.  Her postoperative sutures from May 27, 2003 were removed on  June 06, 2003 to allow her to clean her wound and prepare for the second  procedure.  Overnight, she cleansed her wound and applied Neosporin.  She is  brought to the operating room at this time anticipating nerve exploration  and probable pin removal.   DESCRIPTION OF PROCEDURE:  Kristi Thomas was brought to the operating  room and placed in supine position on the operating table.  A pneumatic  tourniquet was applied to the proximal right brachium.  Following sedation  and Betadine prep, 1% lidocaine and 0.25% Marcaine were placed at the base  of  the thumb metacarpal in an effort to obtain a block of the radial proper  digital nerve and the radial dorsal sensory branch and the ulnar dorsal  sensory branch of the thumb.  When anesthesia was satisfactory the arm was  prepped with Betadine soap and solution and sterilely draped.   The forearm and arm were exsanguinated with an Esmarch bandage and an  arterial tourniquet on the proximal brachium inflated to .  The  procedure commenced with a meticulous incision in the skin using fine dural  hooks to elevate the skin in a directly superior manner.  The pin was  carefully palpated and identified with blunt dissection utilizing a fine  hemostat.  The pin was rotated away from the path of the nerve and the  adjacent tissues gently dissected.  The radial dorsal sensory branch that  had been mobilized during surgery and well visualized throughout the  procedure was noted to be tented with a 40 degree bend in the nerve  approximately 3 mm from the tip of the pin.  It was apparent that at the  time of pin placement, we went ulnar to the nerve as was planned and with  rotating the pin to a subcutaneous position with the tip of the pin more  radial and palmar, the nerve slipped over the dorsal surface of the pin and  was tented.  With abduction and adduction of the thumb, I am certain that  the nerve was rubbing on the bend of the pin.  The pin was removed without  difficulty utilizing a Stout needle driver followed by gentle neurolysis of  the nerve with magnification relieving all granulation tissue to assure that  the nerve was indeed intact. There were no signs of disruption of the  epineurium or fascicular structure of the nerve.  The wound was then  repaired with intradermal 3-0 Prolene and a Steri-Strip.  One gram of Ancef  had been administered as an IV prophylactic antibiotic and the tourniquet  was released.  Immediate capillary refill was noted to all fingers and the   thumb.   The wound was dressed with Steri-Strips, sterile gauze, sterile Webril and a  carefully designed thumb spica plaster splint with plaster along the ulnar  aspect of the thumb and dorsal forearm and plaster on the volar aspect of  the forearm and palm absolutely relieving pressure over the path of the  radial sensory branch.  Dr. [name] was awakened from sedation and  transferred to the recovery room with stable vital  signs.  There were no  apparent complications.   For aftercare, she was given a prescription for Augmentin 875 mg one by  mouth twice daily times four days as a prophylactic antibiotic and a refill  prescription of Vicodin ES 30 tablets one by mouth every four to six hours  as needed for pain.  She will begin tapering the Neurontin immediately.                                               Katy Fitch Naaman Plummer., M.D.    RVS/MEDQ  D:  06/07/2003  T:  06/07/2003  Job:  [id number]

## 2010-12-21 NOTE — H&P (Signed)
[facility]  Patient:    Kristi Thomas, Kristi Thomas                 MRN: [id number] Adm. Date:  [id number] Attending:  Maxie Better CC:         [facility]                         History and Physical  CHIEF COMPLAINT:  Oligohydramnios.  HISTORY OF PRESENT ILLNESS:  The patient is a 45 year old white female, GII, PI/0/0/I, EDD of 02/09/00 at 36-1/2 weeks, with profound oligohydramnios, AFI less than 5 in the office today.  ALLERGIES:  No known drug allergies.  MEDICATIONS:  Prenatal vitamins and Procardia.  PAST MEDICAL HISTORY:  Remarkable for an uncomplicated spontaneous vaginal delivery of a 6 lb 6 oz female in 1998, vacuum assisted, without complications. Otherwise, has had a laparoscopic hernia repair, history of peptic ulcer 12 years ago, history of urinary tract infection, history of cholecystectomy in 1994, history of hernia in 1995 and history of pyelonephritis in 1992.  FAMILY HISTORY:  Cardiovascular disease, emphysema and hypertension.  HABITS:  She is a nonsmoker/nondrinker.  Denies domestic or physical violence.  PHYSICAL EXAMINATION:  GENERAL:  She is a well-developed, well-nourished white female in no apparent distress.  HEENT:  Normal.  LUNGS:  Clear.  HEART:  Regular rhythm.  ABDOMEN:  Soft, gravid, nontender; estimated fetal weight 6 lb 15 oz.  No CVA tenderness is noted.  Cervix is 2 cm and 50% vertex and -2.  EXTREMITIES:  Revealed no cords.  NEUROLOGIC:  Exam nonfocal.  IMPRESSION:  A 36-1/2 week intrauterine pregnancy with new onset severe oligo- hydramnios.  PLAN:  Proceed with cervical ripening and induction. DD:  01/15/00 TD:  01/15/00 Job: [id number]

## 2010-12-21 NOTE — Procedures (Signed)
EEG NUMBER:  [id number].   This is a sleep-deprived EEG in a patient who has had a single seizure.  Medications were meperidine, morphine sulfate, Reglan, and Phenergan.   TECHNICAL DESCRIPTION:  This EEG was recorded during awake and stage II  sleep states. The background activity shows 11 and 12 Hz rhythms during the  awake state. There is no evidence of any focal asymmetry present. Photic  stimulation was performed which produced no abnormalities. Hyperventilation  testing produced no significant abnormalities. Sharp waves were noted in the  T6 electrode during drowsiness, but no definite evidence of any other focal  asymmetry or epileptiform activity was present.   This is a normal EEG during the awake and stage II sleep states.           ______________________________  Genene Churn. Sandria Manly, M.D.     UEA:[id number]  D:  06/11/2005 19:41:56  T:  06/12/2005 08:06:44  Job #:  [id number]

## 2012-09-21 ENCOUNTER — Other Ambulatory Visit (INDEPENDENT_AMBULATORY_CARE_PROVIDER_SITE_OTHER): Payer: Self-pay | Admitting: Surgery

## 2012-09-21 DIAGNOSIS — G4700 Insomnia, unspecified: Secondary | ICD-10-CM | POA: Insufficient documentation

## 2012-09-21 MED ORDER — TRAZODONE HCL 300 MG PO TABS: 300.0000 mg | ORAL_TABLET | Freq: Every day | ORAL | Status: DC

## 2013-01-18 ENCOUNTER — Other Ambulatory Visit: Payer: Self-pay | Admitting: Obstetrics and Gynecology

## 2013-02-15 ENCOUNTER — Other Ambulatory Visit (INDEPENDENT_AMBULATORY_CARE_PROVIDER_SITE_OTHER): Payer: Self-pay | Admitting: Surgery

## 2013-02-15 ENCOUNTER — Ambulatory Visit (HOSPITAL_COMMUNITY)
Admission: RE | Admit: 2013-02-15 | Discharge: 2013-02-15 | Disposition: A | Discharge status: Home / Self Care | Payer: Managed Care, Other (non HMO) | Source: Ambulatory Visit | Attending: Surgery | Admitting: Surgery

## 2013-02-15 DIAGNOSIS — M7989 Other specified soft tissue disorders: Secondary | ICD-10-CM | POA: Insufficient documentation

## 2013-02-15 DIAGNOSIS — M79609 Pain in unspecified limb: Secondary | ICD-10-CM | POA: Insufficient documentation

## 2013-02-15 DIAGNOSIS — S93401A Sprain of unspecified ligament of right ankle, initial encounter: Secondary | ICD-10-CM

## 2013-02-26 ENCOUNTER — Other Ambulatory Visit (INDEPENDENT_AMBULATORY_CARE_PROVIDER_SITE_OTHER): Payer: Self-pay | Admitting: Surgery

## 2013-02-26 DIAGNOSIS — G4700 Insomnia, unspecified: Secondary | ICD-10-CM

## 2013-02-26 MED ORDER — TRAZODONE HCL 300 MG PO TABS: 300.0000 mg | ORAL_TABLET | Freq: Every day | ORAL | Status: DC

## 2013-02-26 NOTE — Telephone Encounter (Signed)
Call requesting refill of trazodone which she has taken for the last 4 years for sleep.  She reports no depression or suicidal ideations. Refill for 300 mg (90)

## 2013-04-04 ENCOUNTER — Encounter (INDEPENDENT_AMBULATORY_CARE_PROVIDER_SITE_OTHER): Payer: Self-pay | Admitting: Surgery

## 2013-04-04 NOTE — Progress Notes (Signed)
Patient ID: Kristi Thomas, female   DOB: April 04, 1966, 48 y.o.   MRN: [id number] In office procedure;  Removal of sebaceous cyst overlying the distal end of the left clavicle (on bra strap line) Left clavicular area was prepped with cholorhexidine and draped.  Infiltrated with 1%lido with epi and Neut.  The 1 cm raised, soft mass vaguely resembled a lipoma.  An ellipse of skin was taken and the underlying mass was excised in toto from the surrounding tissue.   Closed with 4-0 vicryl and 5-0 prolene simple sutures.  The mass was a sebaceous cyst grossly and the patient declined to have it sent to pathology.  Dressing applied.  Matt B. Daphine Deutscher, MD, [facility] [phone] beeper [phone]  04/04/2013 9:58 PM

## 2013-05-28 ENCOUNTER — Other Ambulatory Visit (INDEPENDENT_AMBULATORY_CARE_PROVIDER_SITE_OTHER): Payer: Self-pay | Admitting: Surgery

## 2013-05-28 ENCOUNTER — Encounter (INDEPENDENT_AMBULATORY_CARE_PROVIDER_SITE_OTHER): Payer: Self-pay | Admitting: Surgery

## 2013-05-28 DIAGNOSIS — G4700 Insomnia, unspecified: Secondary | ICD-10-CM

## 2013-05-28 MED ORDER — TRAZODONE HCL 300 MG PO TABS: 300.0000 mg | ORAL_TABLET | Freq: Every day | ORAL | Status: DC

## 2013-05-28 NOTE — Telephone Encounter (Signed)
Patient reports good restful sleep pattern.  No suicidal thoughts.  Requests refill of medication.  Trazodone refilled.

## 2013-10-27 ENCOUNTER — Encounter: Payer: Self-pay | Admitting: Obstetrics & Gynecology

## 2013-10-27 ENCOUNTER — Ambulatory Visit (INDEPENDENT_AMBULATORY_CARE_PROVIDER_SITE_OTHER): Payer: BC Managed Care – PPO | Admitting: Obstetrics & Gynecology

## 2013-10-27 VITALS — BP 111/70 | HR 91 | Resp 16 | Ht 65.5 in | Wt 129.0 lb

## 2013-10-27 DIAGNOSIS — Z01419 Encounter for gynecological examination (general) (routine) without abnormal findings: Secondary | ICD-10-CM

## 2013-10-27 DIAGNOSIS — Z124 Encounter for screening for malignant neoplasm of cervix: Secondary | ICD-10-CM

## 2013-10-27 DIAGNOSIS — Z0000 Encounter for general adult medical examination without abnormal findings: Secondary | ICD-10-CM

## 2013-10-27 DIAGNOSIS — Z1151 Encounter for screening for human papillomavirus (HPV): Secondary | ICD-10-CM

## 2013-10-27 MED ORDER — NORETHINDRONE 0.35 MG PO TABS: 1.0000 | ORAL_TABLET | Freq: Every day | ORAL | Status: AC

## 2013-10-27 NOTE — Progress Notes (Signed)
Subjective:    Kristi Thomas is a 48 y.o. female who presents for an annual exam. The patient has no complaints today. Needs pap and mammogram. The patient is not currently sexually active. GYN screening history: last pap: was normal. The patient wears seatbelts: yes. The patient participates in regular exercise: yes. Has the patient ever been transfused or tattooed?: no. The patient reports that there is not domestic violence in her life.   Menstrual History: OB History   Grav Para Term Preterm Abortions TAB SAB Ect Mult Living   2 2 2       2       Menarche age: [age]  No LMP recorded. Patient is postmenopausal.    The following portions of the patient's history were reviewed and updated as appropriate: allergies, current medications, past family history, past medical history, past social history, past surgical history and problem list.  Review of Systems A comprehensive review of systems was negative. Periods stopped 14 years ago. Lost 1/2 inch of height. She felt much better when taking the micronor, gained a lot of midsection weight when she stopped taking it 7/14.   Objective:    BP 111/70  Pulse 91  Resp 16  Ht 5' 5.5" (1.664 m)  Wt 129 lb (58.514 kg)  BMI 21.13 kg/m2  General Appearance:    Alert, cooperative, no distress, appears stated age  Head:    Normocephalic, without obvious abnormality, atraumatic  Eyes:    PERRL, conjunctiva/corneas clear, EOM's intact, fundi    benign, both eyes  Ears:    Normal TM's and external ear canals, both ears  Nose:   Nares normal, septum midline, mucosa normal, no drainage    or sinus tenderness  Throat:   Lips, mucosa, and tongue normal; teeth and gums normal  Neck:   Supple, symmetrical, trachea midline, no adenopathy;    thyroid:  no enlargement/tenderness/nodules; no carotid   bruit or JVD  Back:     Symmetric, no curvature, ROM normal, no CVA tenderness  Lungs:     Clear to auscultation bilaterally, respirations unlabored  Chest  Wall:    No tenderness or deformity   Heart:    Regular rate and rhythm, S1 and S2 normal, no murmur, rub   or gallop  Breast Exam:    No tenderness, masses, or nipple abnormality  Abdomen:     Soft, non-tender, bowel sounds active all four quadrants,    no masses, no organomegaly  Genitalia:    Normal female without lesion, discharge or tenderness, NSSA, non-palpable adnexa     Extremities:   Extremities normal, atraumatic, no cyanosis or edema  Pulses:   2+ and symmetric all extremities  Skin:   Skin color, texture, turgor normal, no rashes or lesions  Lymph nodes:   Cervical, supraclavicular, and axillary nodes normal  Neurologic:   CNII-XII intact, normal strength, sensation and reflexes    throughout  .    Assessment:    Healthy female exam.    Plan:     Breast self exam technique reviewed and patient encouraged to perform self-exam monthly. Mammogram. Thin prep Pap smear. with cotesting I have offered her a DEXA but she wants to delay that at this time. I will check a TSH I will prescribe the micronor to see if her weight decreases.

## 2013-10-28 ENCOUNTER — Telehealth: Payer: Self-pay | Admitting: *Deleted

## 2013-10-28 ENCOUNTER — Ambulatory Visit (HOSPITAL_COMMUNITY)
Admission: RE | Admit: 2013-10-28 | Discharge: 2013-10-28 | Disposition: A | Discharge status: Home / Self Care | Payer: BC Managed Care – PPO | Source: Ambulatory Visit | Attending: Obstetrics & Gynecology | Admitting: Obstetrics & Gynecology

## 2013-10-28 ENCOUNTER — Other Ambulatory Visit: Payer: Self-pay | Admitting: Obstetrics & Gynecology

## 2013-10-28 ENCOUNTER — Ambulatory Visit (HOSPITAL_COMMUNITY): Admission: RE | Admit: 2013-10-28 | Payer: BC Managed Care – PPO | Source: Ambulatory Visit

## 2013-10-28 DIAGNOSIS — Z0000 Encounter for general adult medical examination without abnormal findings: Secondary | ICD-10-CM

## 2013-10-28 DIAGNOSIS — Z1231 Encounter for screening mammogram for malignant neoplasm of breast: Secondary | ICD-10-CM

## 2013-10-28 LAB — TSH: TSH: 1.545 u[IU]/mL (ref 0.350–4.500)

## 2013-10-28 NOTE — Telephone Encounter (Signed)
LM on voicemail of normal TSH results.

## 2014-04-02 IMAGING — CR DG FOOT COMPLETE 3+V*R*
3 series · 3 of 3 positions shown · non-contrast
Comparison: None.

CLINICAL DATA: 46-year-old female status post blunt trauma, this is
that.  Pain swelling and bruising at the base of the fifth
metatarsal.

RIGHT FOOT COMPLETE - 3+ VIEW

[t foot ap right]
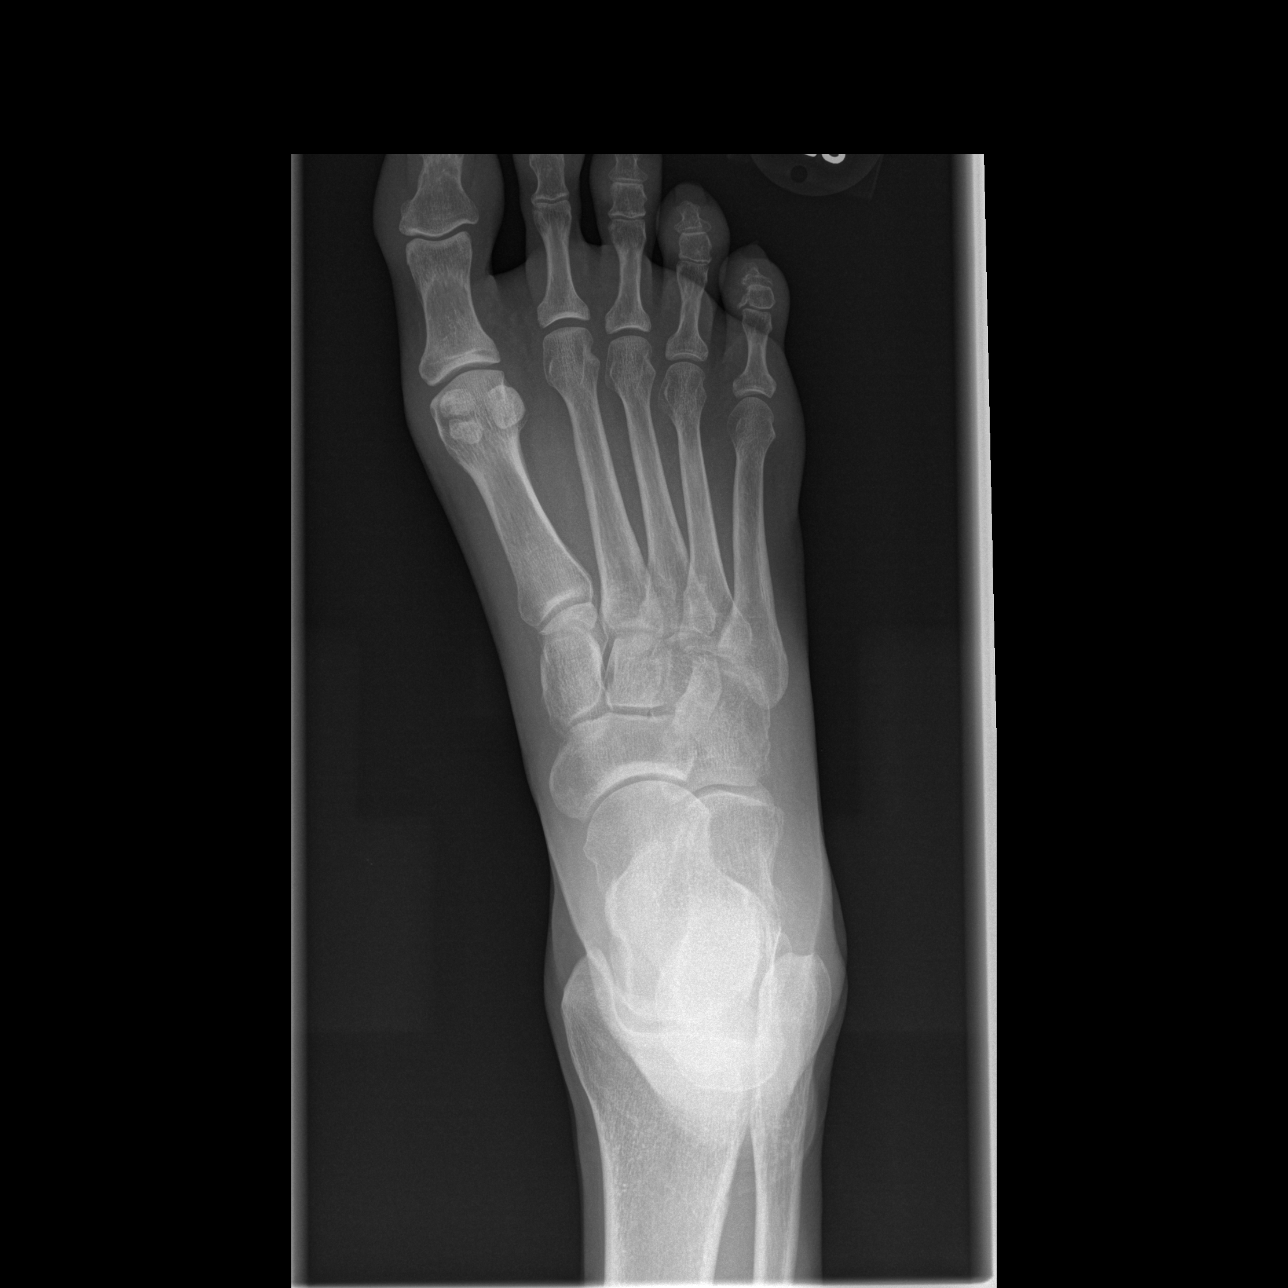

[t foot oblique right]
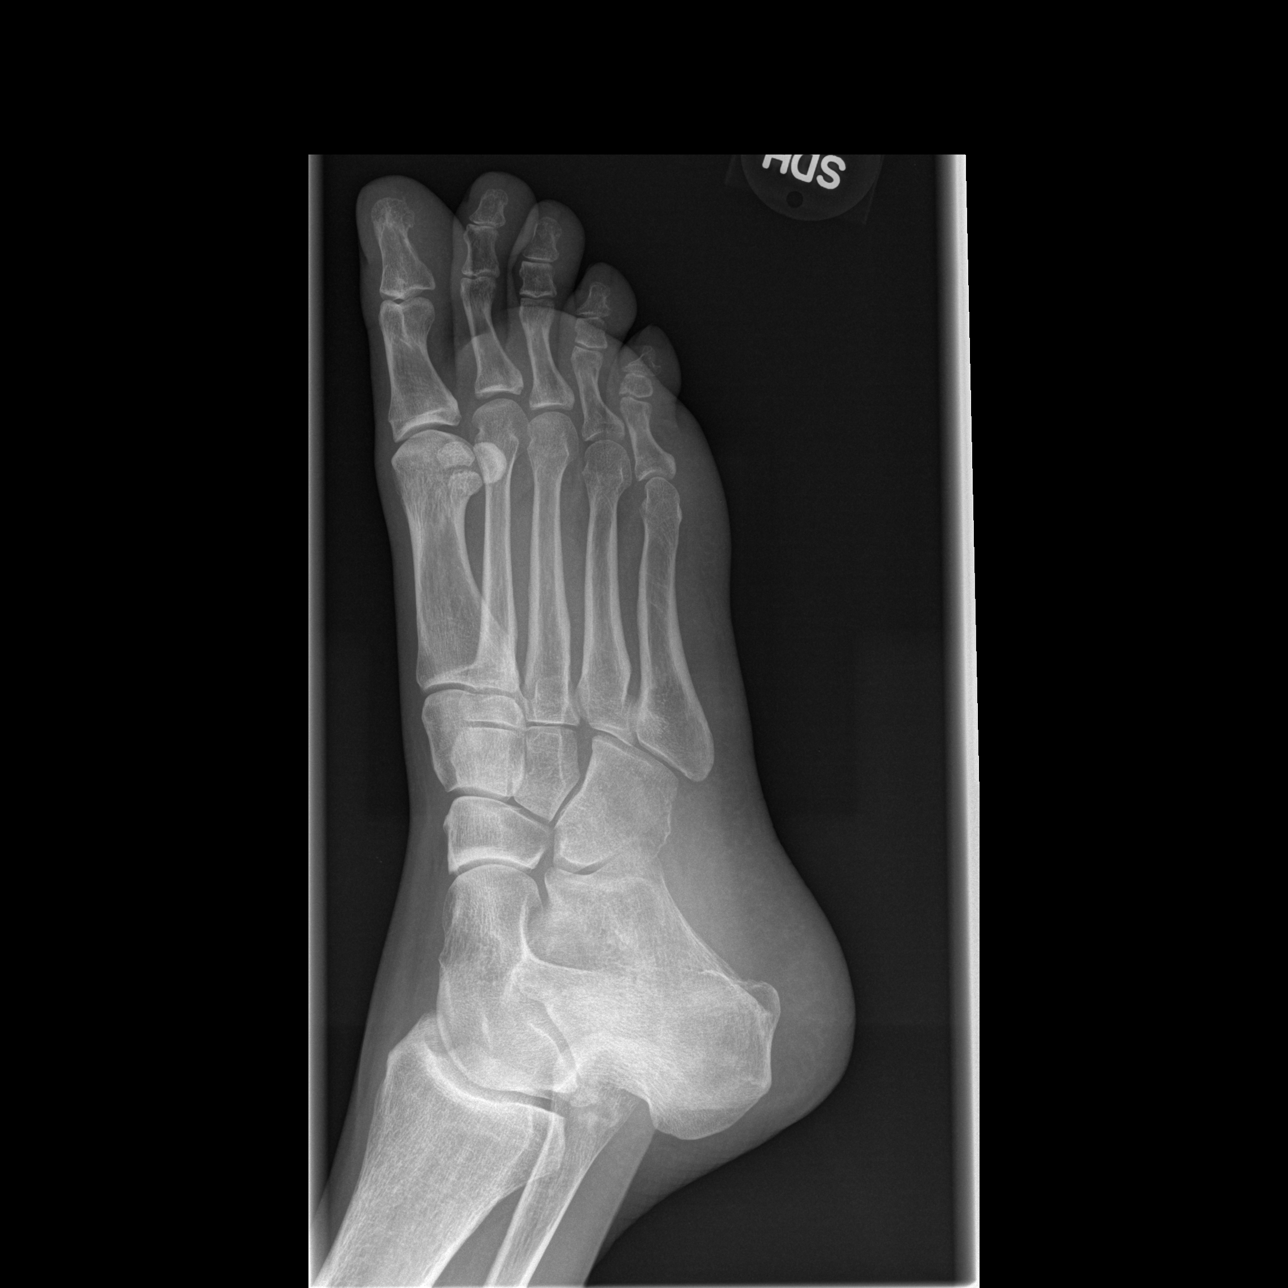

[t foot lat right]
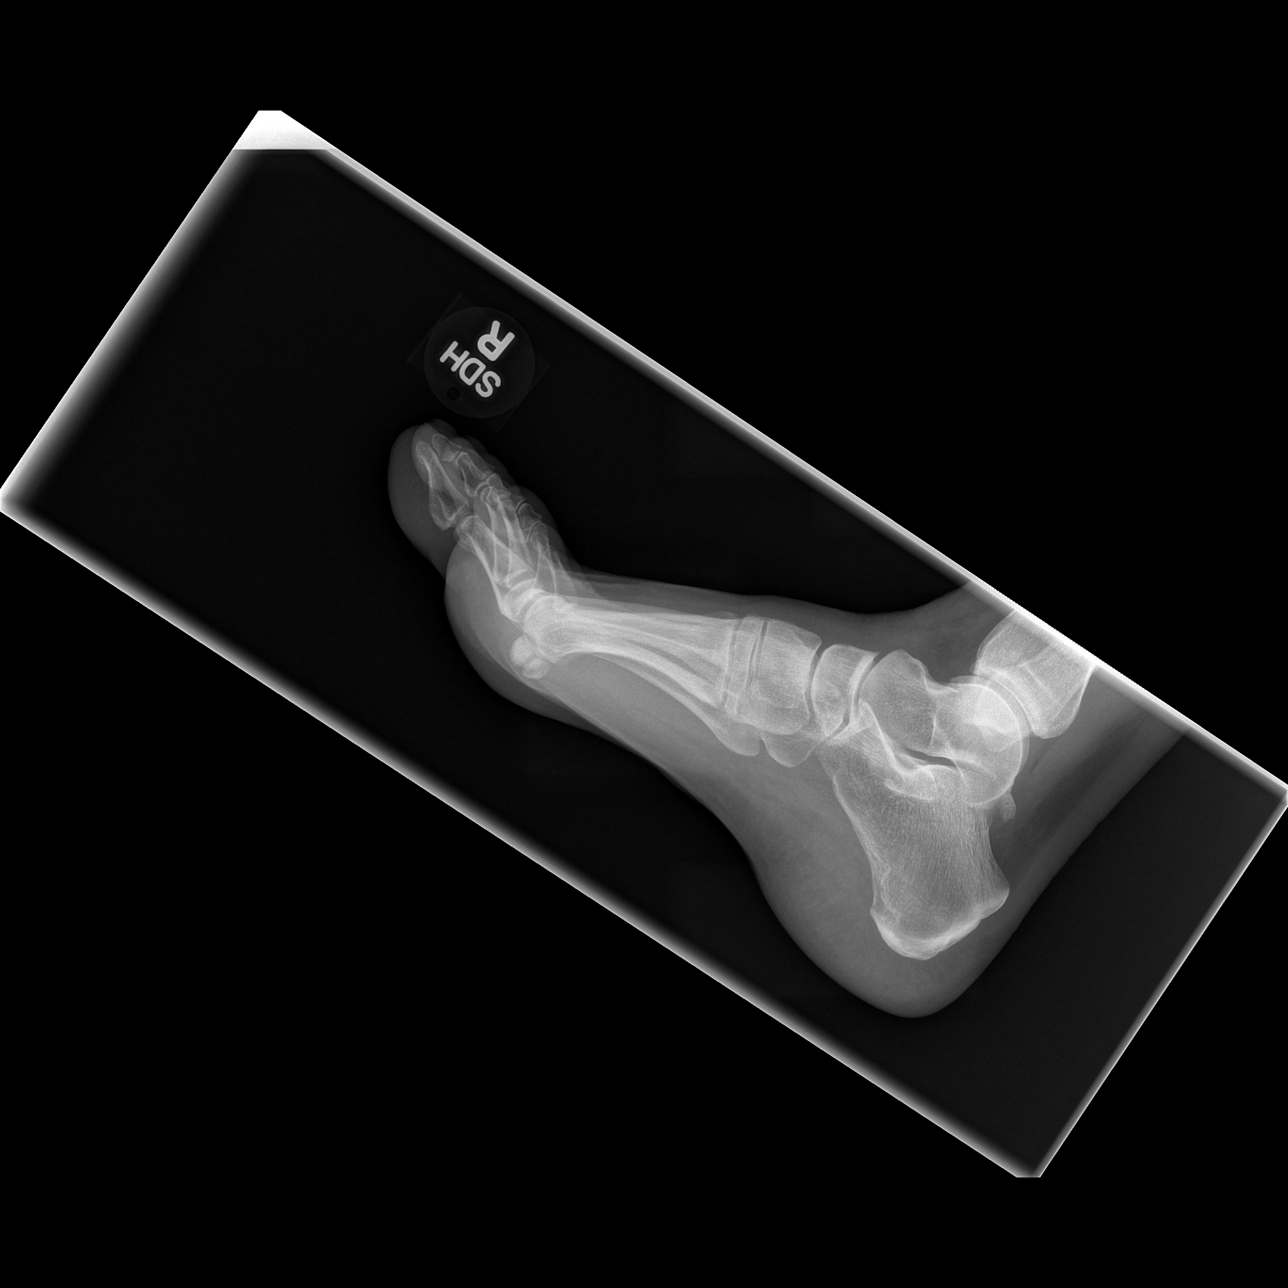

[3 of 3 positions shown; findings below may reference images not displayed]

FINDINGS: Bone mineralization is within normal limits.  Calcaneus
intact.  Joint spaces and alignment preserved.  Nearby tarsal bones
and other right foot metatarsals are within normal limits.  There
is a mild chronic deformity of the fifth middle phalanx.  No acute
fracture or dislocation identified.
IMPRESSION: No acute fracture or dislocation identified about the right foot.

## 2014-06-06 ENCOUNTER — Encounter: Payer: Self-pay | Admitting: Obstetrics & Gynecology

## 2014-07-01 ENCOUNTER — Other Ambulatory Visit (INDEPENDENT_AMBULATORY_CARE_PROVIDER_SITE_OTHER): Payer: Self-pay | Admitting: Surgery

## 2014-07-01 DIAGNOSIS — G4700 Insomnia, unspecified: Secondary | ICD-10-CM

## 2014-07-01 MED ORDER — TRAZODONE HCL 300 MG PO TABS
300.0000 mg | ORAL_TABLET | Freq: Every day | ORAL | Status: AC
Start: 2014-07-01 — End: ?

## 2014-07-01 NOTE — Telephone Encounter (Signed)
Requesting refill for Trazodone for sleep.  On medication chronically.  No suicidal ideations and appears to have desired effect

## 2015-03-06 DEATH — deceased
